# Patient Record
Sex: Male | Born: 1995 | Race: White | Hispanic: No | Marital: Single | Smoking: Current some day smoker
Health system: Southern US, Community
[De-identification: ages and names within clinical notes are randomized; demographics above are authoritative.]

## PROBLEM LIST (undated history)

## (undated) DIAGNOSIS — F909 Attention-deficit hyperactivity disorder, unspecified type: Secondary | ICD-10-CM

## (undated) DIAGNOSIS — J302 Other seasonal allergic rhinitis: Secondary | ICD-10-CM

## (undated) DIAGNOSIS — T7840XA Allergy, unspecified, initial encounter: Secondary | ICD-10-CM

## (undated) HISTORY — DX: Allergy, unspecified, initial encounter: T78.40XA

---
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
## 2018-02-19 ENCOUNTER — Ambulatory Visit (INDEPENDENT_AMBULATORY_CARE_PROVIDER_SITE_OTHER): Payer: No Typology Code available for payment source | Admitting: Emergency Medicine

## 2018-02-19 ENCOUNTER — Other Ambulatory Visit: Payer: Self-pay

## 2018-02-19 ENCOUNTER — Encounter: Payer: Self-pay | Admitting: Emergency Medicine

## 2018-02-19 VITALS — BP 100/56 | HR 50 | Temp 98.5°F | Resp 16 | Ht 68.5 in | Wt 158.0 lb

## 2018-02-19 DIAGNOSIS — J029 Acute pharyngitis, unspecified: Secondary | ICD-10-CM | POA: Insufficient documentation

## 2018-02-19 LAB — POCT RAPID STREP A (OFFICE): Rapid Strep A Screen: NEGATIVE

## 2018-02-19 MED ORDER — AZITHROMYCIN 250 MG PO TABS
ORAL_TABLET | ORAL | 0 refills | Status: DC
Start: 2018-02-19 — End: 2018-04-14

## 2018-02-19 NOTE — Patient Instructions (Addendum)
     IF you received an x-ray today, you will receive an invoice from [facility]. Please contact [facility] at [phone] with questions or concerns regarding your invoice.   IF you received labwork today, you will receive an invoice from [facility]. Please contact [facility] at [phone] with questions or concerns regarding your invoice.   Our billing staff will not be able to assist you with questions regarding bills from these companies.  You will be contacted with the lab results as soon as they are available. The fastest way to get your results is to activate your My Chart account. Instructions are located on the last page of this paperwork. If you have not heard from us regarding the results in 2 weeks, please contact this office.     Sore Throat When you have a sore throat, your throat may:  Hurt.  Burn.  Feel irritated.  Feel scratchy.  Many things can cause a sore throat, including:  An infection.  Allergies.  Dryness in the air.  Smoke or pollution.  Gastroesophageal reflux disease (GERD).  A tumor.  A sore throat can be the first sign of another sickness. It can happen with other problems, like coughing or a fever. Most sore throats go away without treatment. Follow these instructions at home:  Take over-the-counter medicines only as told by your doctor.  Drink enough fluids to keep your pee (urine) clear or pale yellow.  Rest when you feel you need to.  To help with pain, try: ? Sipping warm liquids, such as broth, herbal tea, or warm water. ? Eating or drinking cold or frozen liquids, such as frozen ice pops. ? Gargling with a salt-water mixture 3-4 times a day or as needed. To make a salt-water mixture, add -1 tsp of salt in 1 cup of warm water. Mix it until you cannot see the salt anymore. ? Sucking on hard candy or throat lozenges. ? Putting a cool-mist humidifier in your bedroom at night. ? Sitting in the bathroom with the  door closed for 5-10 minutes while you run hot water in the shower.  Do not use any tobacco products, such as cigarettes, chewing tobacco, and e-cigarettes. If you need help quitting, ask your doctor. Contact a doctor if:  You have a fever for more than 2-3 days.  You keep having symptoms for more than 2-3 days.  Your throat does not get better in 7 days.  You have a fever and your symptoms suddenly get worse. Get help right away if:  You have trouble breathing.  You cannot swallow fluids, soft foods, or your saliva.  You have swelling in your throat or neck that gets worse.  You keep feeling like you are going to throw up (vomit).  You keep throwing up. This information is not intended to replace advice given to you by your health care provider. Make sure you discuss any questions you have with your health care provider. Document Released: 06/19/2008 Document Revised: 05/06/2016 Document Reviewed: 07/01/2015 Elsevier Interactive Patient Education  2018 Elsevier Inc.  

## 2018-02-19 NOTE — Progress Notes (Signed)
Kirk Murray 22 y.o.   Chief Complaint  Patient presents with  . Sore Throat    x 1 week - per patient TONSILS SWOLLEN and BUMPS on his tongue    HISTORY OF PRESENT ILLNESS: This is a 22 y.o. male complaining of sore throat for [redacted] week along with swollen tonsils and bumps on the side of his tongue.  Denies fever or chills.  Denies trouble swallowing solids or liquids.  No other significant symptoms.  Denies nausea or vomiting.  HPI   Prior to Admission medications   Medication Sig Start Date End Date Taking? Authorizing Provider  Ibuprofen (ADVIL) 200 MG CAPS Take by mouth as needed.   Yes [provider]  albuterol (PROVENTIL HFA;VENTOLIN HFA) 108 (90 BASE) MCG/ACT inhaler Inhale 2 puffs into the lungs every 6 (six) hours as needed. wheezing    [provider]  albuterol (PROVENTIL) (2.5 MG/3ML) 0.083% nebulizer solution Take 2.5 mg by nebulization once.    [provider]  diphenhydrAMINE (BENADRYL) 25 mg capsule Take 25 mg by mouth once.    [provider]  Fluticasone-Salmeterol (ADVAIR) 250-50 MCG/DOSE AEPB Inhale 1 puff into the lungs once.    [provider]  PRESCRIPTION MEDICATION Take 1 capsule by mouth 2 (two) times daily. Focalin XR     [provider]    No Known Allergies  Patient Active Problem List   Diagnosis Date Noted  . Asthma 06/15/2012    Past Medical History:  Diagnosis Date  . ADHD (attention deficit hyperactivity disorder)   . Allergy   . Asthma   . Seasonal allergies     No past surgical history on file.  Social History   Socioeconomic History  . Marital status: Single    Spouse name: Not on file  . Number of children: Not on file  . Years of education: Not on file  . Highest education level: Not on file  Occupational History  . Not on file  Social Needs  . Financial resource strain: Not on file  . Food insecurity:    Worry: Not on file    Inability: Not on file  .  Transportation needs:    Medical: Not on file    Non-medical: Not on file  Tobacco Use  . Smoking status: Current Some Day Smoker  . Smokeless tobacco: Never Used  . Tobacco comment: sometimes  Substance and Sexual Activity  . Alcohol use: Not on file    Comment: 2-3 occasionlly  . Drug use: Yes    Types: Marijuana  . Sexual activity: Not on file  Lifestyle  . Physical activity:    Days per week: Not on file    Minutes per session: Not on file  . Stress: Not on file  Relationships  . Social connections:    Talks on phone: Not on file    Gets together: Not on file    Attends religious service: Not on file    Active member of club or organization: Not on file    Attends meetings of clubs or organizations: Not on file    Relationship status: Not on file  . Intimate partner violence:    Fear of current or ex partner: Not on file    Emotionally abused: Not on file    Physically abused: Not on file    Forced sexual activity: Not on file  Other Topics Concern  . Not on file  Social History Narrative  . Not on file  No family history on file.   Review of Systems  Constitutional: Negative.  Negative for chills and fever.  HENT: Positive for sore throat.   Eyes: Negative.   Respiratory: Negative.  Negative for shortness of breath.   Cardiovascular: Negative.  Negative for chest pain and palpitations.  Gastrointestinal: Negative for abdominal pain, nausea and vomiting.  Genitourinary: Negative.   Musculoskeletal: Negative.   Skin: Negative.  Negative for rash.  Neurological: Negative.  Negative for dizziness and headaches.  Endo/Heme/Allergies: Negative.    Vitals:   02/19/18 1458  BP: (!) 100/56  Pulse: (!) 50  Resp: 16  Temp: 98.5 F (36.9 C)  SpO2: 99%     Physical Exam  Constitutional: He is oriented to person, place, and time. He appears well-developed and well-nourished.  HENT:  Head: Normocephalic and atraumatic.  Nose: Nose normal.  Mouth/Throat:  Uvula is midline. No oral lesions. Posterior oropharyngeal erythema present. No oropharyngeal exudate, posterior oropharyngeal edema or tonsillar abscesses.  Eyes: Pupils are equal, round, and reactive to light. Conjunctivae and EOM are normal.  Neck: Normal range of motion. Neck supple. No thyromegaly present.  Cardiovascular: Normal rate, regular rhythm and normal heart sounds.  Pulmonary/Chest: Effort normal and breath sounds normal.  Musculoskeletal: Normal range of motion.  Lymphadenopathy:    He has no cervical adenopathy.  Neurological: He is alert and oriented to person, place, and time. No sensory deficit. He exhibits normal muscle tone.  Skin: Skin is warm and dry. Capillary refill takes less than 2 seconds.  Psychiatric: He has a normal mood and affect. His behavior is normal.  Vitals reviewed.   Results for orders placed or performed in visit on 02/19/18 (from the past 24 hour(s))  POCT rapid strep A     Status: None   Collection Time: 02/19/18  3:22 PM  Result Value Ref Range   Rapid Strep A Screen Negative Negative    ASSESSMENT & PLAN: Kirk Murray was seen today for sore throat.  Diagnoses and all orders for this visit:  Sorethroat -     POCT rapid strep A -     azithromycin (ZITHROMAX) 250 MG tablet; Sig as indicated -     Culture, Group A Strep  Acute pharyngitis, unspecified etiology -     azithromycin (ZITHROMAX) 250 MG tablet; Sig as indicated -     Culture, Group A Strep    Patient Instructions       IF you received an x-ray today, you will receive an invoice from [facility]. Please contact [facility] at [phone] with questions or concerns regarding your invoice.   IF you received labwork today, you will receive an invoice from [facility]. Please contact [facility] at [phone] with questions or concerns regarding your invoice.   Our billing staff will not be able to assist you with questions regarding bills from these  companies.  You will be contacted with the lab results as soon as they are available. The fastest way to get your results is to activate your My Chart account. Instructions are located on the last page of this paperwork. If you have not heard from [place] regarding the results in 2 weeks, please contact this office.     Sore Throat When you have a sore throat, your throat may:  Hurt.  Burn.  Feel irritated.  Feel scratchy.  Many things can cause a sore throat, including:  An infection.  Allergies.  Dryness in the air.  Smoke or pollution.  Gastroesophageal reflux disease (  GERD).  A tumor.  A sore throat can be the first sign of another sickness. It can happen with other problems, like coughing or a fever. Most sore throats go away without treatment. Follow these instructions at home:  Take over-the-counter medicines only as told by your doctor.  Drink enough fluids to keep your pee (urine) clear or pale yellow.  Rest when you feel you need to.  To help with pain, try: ? Sipping warm liquids, such as broth, herbal tea, or warm water. ? Eating or drinking cold or frozen liquids, such as frozen ice pops. ? Gargling with a salt-water mixture 3-4 times a day or as needed. To make a salt-water mixture, add -1 tsp of salt in 1 cup of warm water. Mix it until you cannot see the salt anymore. ? Sucking on hard candy or throat lozenges. ? Putting a cool-mist humidifier in your bedroom at night. ? Sitting in the bathroom with the door closed for 5-10 minutes while you run hot water in the shower.  Do not use any tobacco products, such as cigarettes, chewing tobacco, and e-cigarettes. If you need help quitting, ask your doctor. Contact a doctor if:  You have a fever for more than 2-3 days.  You keep having symptoms for more than 2-3 days.  Your throat does not get better in 7 days.  You have a fever and your symptoms suddenly get worse. Get help right away if:  You have  trouble breathing.  You cannot swallow fluids, soft foods, or your saliva.  You have swelling in your throat or neck that gets worse.  You keep feeling like you are going to throw up (vomit).  You keep throwing up. This information is not intended to replace advice given to you by your health care provider. Make sure you discuss any questions you have with your health care provider. Document Released: 06/19/2008 Document Revised: 05/06/2016 Document Reviewed: 07/01/2015 Elsevier Interactive Patient Education  2018 Elsevier Inc.      Edwina Barth, MD [facility]

## 2018-02-22 LAB — CULTURE, GROUP A STREP: STREP A CULTURE: NEGATIVE

## 2018-02-24 ENCOUNTER — Encounter: Payer: Self-pay | Admitting: *Deleted

## 2018-04-14 ENCOUNTER — Other Ambulatory Visit: Payer: Self-pay

## 2018-04-14 ENCOUNTER — Ambulatory Visit (INDEPENDENT_AMBULATORY_CARE_PROVIDER_SITE_OTHER): Payer: No Typology Code available for payment source | Admitting: Family Medicine

## 2018-04-14 ENCOUNTER — Encounter: Payer: Self-pay | Admitting: Family Medicine

## 2018-04-14 VITALS — BP 124/88 | HR 98 | Temp 98.9°F | Ht 68.82 in | Wt 158.8 lb

## 2018-04-14 DIAGNOSIS — J029 Acute pharyngitis, unspecified: Secondary | ICD-10-CM | POA: Diagnosis not present

## 2018-04-14 DIAGNOSIS — R198 Other specified symptoms and signs involving the digestive system and abdomen: Secondary | ICD-10-CM | POA: Diagnosis not present

## 2018-04-14 DIAGNOSIS — Z113 Encounter for screening for infections with a predominantly sexual mode of transmission: Secondary | ICD-10-CM | POA: Diagnosis not present

## 2018-04-14 DIAGNOSIS — J3089 Other allergic rhinitis: Secondary | ICD-10-CM

## 2018-04-14 MED ORDER — FLUTICASONE PROPIONATE 50 MCG/ACT NA SUSP: 1.0000 | Freq: Two times a day (BID) | NASAL | 6 refills | Status: AC

## 2018-04-14 NOTE — Progress Notes (Signed)
7/22/201912:08 PM  Kirk Murray January 05, 1996, 22 y.o. male [id number]  Chief Complaint  Patient presents with  . Exposure to STD    Possible exposure, Has throat soreness and open bump on the tongue for the past month.     HPI:   Patient is a 22 y.o. male who presents today for sore throat and bumps on right tongue x 1 month  Noticed bumps on right tongue about a month ago, they are not painful, no burning, they have not changed nor spread He has also had a sore throat for most of this time He is worried about STD, states that several people within his circle have been having sore throats He reports consistent condom use  Works Aeronautical engineerlandscaping, bad allergies, PND No fever, chills, swollen glands, ear pain, sinus pain, nausea, vomiting, abd pain, penile discharge, dysuria He reports he bites the right side of his tongue often  Fall Risk  04/14/2018 02/19/2018  Falls in the past year? No Yes  Number falls in past yr: - 2 or more  Injury with Fall? - (No Data)  Comment - play soccer and fell because of blackout     [facility] 04/14/2018 02/19/2018  Decreased Interest 0 0  Down, Depressed, Hopeless 0 1  PHQ - 2 Score 0 1    No Known Allergies  Prior to Admission medications   Medication Sig Start Date End Date Taking? Authorizing Provider  albuterol (PROVENTIL HFA;VENTOLIN HFA) 108 (90 BASE) MCG/ACT inhaler Inhale 2 puffs into the lungs every 6 (six) hours as needed. wheezing    [provider]  albuterol (PROVENTIL) (2.5 MG/3ML) 0.083% nebulizer solution Take 2.5 mg by nebulization once.    [provider]  diphenhydrAMINE (BENADRYL) 25 mg capsule Take 25 mg by mouth once.    [provider]  Fluticasone-Salmeterol (ADVAIR) 250-50 MCG/DOSE AEPB Inhale 1 puff into the lungs once.    [provider]  Ibuprofen (ADVIL) 200 MG CAPS Take by mouth as needed.    [provider]  PRESCRIPTION MEDICATION Take 1 capsule by  mouth 2 (two) times daily. Focalin XR 25mg     [provider]    Past Medical History:  Diagnosis Date  . ADHD (attention deficit hyperactivity disorder)   . Allergy   . Asthma   . Seasonal allergies     History reviewed. No pertinent surgical history.  Social History   Tobacco Use  . Smoking status: Current Some Day Smoker  . Smokeless tobacco: Never Used  . Tobacco comment: sometimes  Substance Use Topics  . Alcohol use: Not on file    Comment: 2-3 occasionlly    History reviewed. No pertinent family history.  ROS Per hpi  OBJECTIVE:  Blood pressure 124/88, pulse 98, temperature 98.9 F (37.2 C), temperature source Oral, height 5' 8.82" (1.748 m), weight 158 lb 12.8 oz (72 kg), SpO2 98 %. Body mass index is 23.57 kg/m.   Physical Exam  Constitutional: He is oriented to person, place, and time. He appears well-developed and well-nourished.  HENT:  Head: Normocephalic and atraumatic.  Right Ear: Hearing, tympanic membrane, external ear and ear canal normal.  Left Ear: Hearing, tympanic membrane, external ear and ear canal normal.  Mouth/Throat: Oropharynx is clear and moist. No oropharyngeal exudate (he does PND with cobblestoning).  Right posterior tongue with small area of clustered skin colored papules  Eyes: Pupils are equal, round, and reactive to light. Conjunctivae and EOM are normal.  Neck:  Neck supple.  Cardiovascular: Normal rate and regular rhythm. Exam reveals no gallop and no friction rub.  No murmur heard. Pulmonary/Chest: Effort normal and breath sounds normal. He has no wheezes. He has no rales.  Lymphadenopathy:    He has no cervical adenopathy.  Neurological: He is alert and oriented to person, place, and time.  Skin: Skin is warm and dry.  Psychiatric: He has a normal mood and affect.  Nursing note and vitals reviewed.   ASSESSMENT and PLAN  1. Non-seasonal allergic rhinitis, unspecified trigger 3. Sore throat Discussed  supportive measures, new meds r/se/b and RTC precautions.   2. Screen for STD (sexually transmitted disease) Discussed importance of safer sex practices - HIV antibody - RPR - GC/Chlamydia Probe Amp([facility])  4. Tongue symptom Discussed not an STD, seems for recurring trauma. RTC precautions given.  Other orders - fluticasone (FLONASE) 50 MCG/ACT nasal spray; Place 1 spray into both nostrils 2 (two) times daily.  Return if symptoms worsen or fail to improve.    Myles Lipps, MD [facility] [address] [place], [place] [address] Ph.  [phone] Fax [phone]

## 2018-04-14 NOTE — Patient Instructions (Addendum)
  If flonase id not enough, then please add over the counter oral antihistamine such as generic claritin, zyrtec or allegra   IF you received an x-ray today, you will receive an invoice from [facility]. Please contact [facility] at [phone] with questions or concerns regarding your invoice.   IF you received labwork today, you will receive an invoice from [facility]. Please contact [facility] at [phone] with questions or concerns regarding your invoice.   Our billing staff will not be able to assist you with questions regarding bills from these companies.  You will be contacted with the lab results as soon as they are available. The fastest way to get your results is to activate your My Chart account. Instructions are located on the last page of this paperwork. If you have not heard from us regarding the results in 2 weeks, please contact this office.

## 2018-04-15 LAB — HIV ANTIBODY (ROUTINE TESTING W REFLEX): HIV Screen 4th Generation wRfx: NONREACTIVE

## 2018-04-15 LAB — GC/CHLAMYDIA PROBE AMP
Chlamydia trachomatis, NAA: NEGATIVE
Neisseria gonorrhoeae by PCR: NEGATIVE

## 2018-04-15 LAB — RPR: RPR Ser Ql: NONREACTIVE
